# Patient Record
Sex: Female | Born: 1990 | Hispanic: No | Marital: Single
Health system: Midwestern US, Community
[De-identification: ages and names within clinical notes are randomized; demographics above are authoritative.]

## PROBLEM LIST (undated history)

## (undated) ENCOUNTER — Inpatient Hospital Stay (HOSPITAL_COMMUNITY): Payer: Self-pay

## (undated) HISTORY — PX: FRACTURE SURGERY: SHX138

## (undated) HISTORY — PX: TONSILLECTOMY: SUR1361

---

## 2013-10-28 ENCOUNTER — Encounter (HOSPITAL_COMMUNITY): Payer: Self-pay | Admitting: Emergency Medicine

## 2013-10-28 ENCOUNTER — Emergency Department (HOSPITAL_COMMUNITY)
Admission: EM | Admit: 2013-10-28 | Discharge: 2013-10-28 | Disposition: A | Discharge status: Home / Self Care | Payer: Self-pay | Attending: Emergency Medicine | Admitting: Emergency Medicine

## 2013-10-28 DIAGNOSIS — L02219 Cutaneous abscess of trunk, unspecified: Secondary | ICD-10-CM | POA: Insufficient documentation

## 2013-10-28 DIAGNOSIS — F17200 Nicotine dependence, unspecified, uncomplicated: Secondary | ICD-10-CM | POA: Insufficient documentation

## 2013-10-28 DIAGNOSIS — L0390 Cellulitis, unspecified: Secondary | ICD-10-CM

## 2013-10-28 MED ORDER — CEPHALEXIN 500 MG PO CAPS: 500.0000 mg | ORAL_CAPSULE | Freq: Four times a day (QID) | ORAL | Status: DC

## 2013-10-28 MED ORDER — SULFAMETHOXAZOLE-TRIMETHOPRIM 800-160 MG PO TABS: 1.0000 | ORAL_TABLET | Freq: Two times a day (BID) | ORAL | Status: DC

## 2013-10-28 MED ORDER — HYDROCODONE-ACETAMINOPHEN 5-325 MG PO TABS: 1.0000 | ORAL_TABLET | Freq: Four times a day (QID) | ORAL | Status: DC | PRN

## 2013-10-28 MED ORDER — ONDANSETRON 4 MG PO TBDP
8.0000 mg | ORAL_TABLET | Freq: Once | ORAL | Status: AC
  Administered 2013-10-28: 8 mg via ORAL
  Filled 2013-10-28: qty 2

## 2013-10-28 MED ORDER — PROMETHAZINE HCL 25 MG PO TABS
25.0000 mg | ORAL_TABLET | Freq: Four times a day (QID) | ORAL | Status: DC | PRN
Start: 2013-10-28 — End: 2014-03-15

## 2013-10-28 MED ORDER — HYDROCODONE-ACETAMINOPHEN 5-325 MG PO TABS
2.0000 | ORAL_TABLET | Freq: Once | ORAL | Status: AC
  Administered 2013-10-28: 2 via ORAL
  Filled 2013-10-28: qty 2

## 2013-10-28 NOTE — ED Notes (Signed)
Pt states there is a place on her lower back that she initially thought was a pimple.  Pt states she "mashed" it and then it got worse.  Wound is approximately the size of a nickel, scabbed over, redness surrounding.

## 2013-10-28 NOTE — ED Provider Notes (Signed)
Medical screening examination/treatment/procedure(s) were performed by non-physician practitioner and as supervising physician I was immediately available for consultation/collaboration.  Sherryl Valido T Zanita Millman, MD 10/28/13 1603 

## 2013-10-28 NOTE — ED Provider Notes (Signed)
CSN: [id number]     Arrival date & time 10/28/13  1335 History   First MD Initiated Contact with Patient 10/28/13 1443    This chart was scribed for Claudette Head PA-C, a non-physician practitioner working with Toy Baker, MD by Lewanda Rife, ED Scribe. This patient was seen in room TR10C/TR10C and the patient's care was started at 2:46 PM     Chief Complaint  Patient presents with  . Abscess   (Consider location/radiation/quality/duration/timing/severity/associated sxs/prior Treatment) The history is provided by the patient. No language interpreter was used.    HPI Comments: Sophia Warner is a 22 y.o. female who presents to the Emergency Department complaining of worsening abscess on low back onset 3 days. Describes pain as severe and throbbing. Reports friends have tried to "pop" it and expelled moderate drainage yesterday. Denies associated fever, and other lesions. Denies PMHx of abscesses. Denies other significant PMHx   History reviewed. No pertinent past medical history. Past Surgical History  Procedure Laterality Date  . Tonsillectomy    . Fracture surgery     No family history on file. History  Substance Use Topics  . Smoking status: Current Every Day Smoker -- 1.00 packs/day    Types: Cigarettes  . Smokeless tobacco: Not on file  . Alcohol Use: Yes   OB History   Grav Para Term Preterm Abortions TAB SAB Ect Mult Living                 Review of Systems  Constitutional: Negative for fever.  Skin:       Abscess   A complete 10 system review of systems was obtained and all systems are negative except as noted in the HPI and PMHx.     Allergies  Review of patient's allergies indicates no known allergies.  Home Medications  No current outpatient prescriptions on file. BP 118/81  Pulse 114  Temp(Src) 98.1 F (36.7 C) (Oral)  Resp 18  Wt 119 lb 4.8 oz (54.114 kg)  SpO2 100%  LMP 10/28/2013 Physical Exam  Nursing note and vitals  reviewed. Constitutional: She is oriented to person, place, and time. She appears well-developed and well-nourished. No distress.  HENT:  Head: Normocephalic and atraumatic.  Right Ear: External ear normal.  Left Ear: External ear normal.  Nose: Nose normal.  Mouth/Throat: Oropharynx is clear and moist.  Eyes: Conjunctivae are normal.  Neck: Normal range of motion.  Cardiovascular: Normal rate, regular rhythm and normal heart sounds.   Pulmonary/Chest: Effort normal and breath sounds normal. No stridor. No respiratory distress. She has no wheezes. She has no rales.  Abdominal: Soft. She exhibits no distension.  Musculoskeletal: Normal range of motion.  Neurological: She is alert and oriented to person, place, and time. She has normal strength.  Skin: Skin is warm and dry. She is not diaphoretic. There is erythema.     Indurated abscess with mild warmth noted over low back about 4 cm in diameter with surrounding erythema. No drainage noted. Scabbing noted. No fluctuance, and no streaking.  Psychiatric: She has a normal mood and affect. Her behavior is normal.    ED Course  Procedures (including critical care time)  COORDINATION OF CARE:  Nursing notes reviewed. Vital signs reviewed. Initial pt interview and examination performed.   2:54 PM-Discussed work up plan with pt at bedside, which includes bedside US. Pt agrees with plan.  2:58 PM No drainable abscess visualized with bedside US  Treatment plan initiated:Medications - No data to display  Initial diagnostic testing ordered.    Labs Review Labs Reviewed - No data to display Imaging Review No results found.  EKG Interpretation   None       MDM   1. Cellulitis    Suspect uncomplicated cellulitis based on limited area of involvement, no systemic signs of illness (eg, fever, chills, dehydration, altered mental status, tachypnea, hypotension), no risk factors for serious illness (eg, extremes of age, general  debility, immunocompromised status).   PE reveals redness, swelling, mildly tender, warm to touch. No bleeding. No bullae. Non purulent. Non circumferential.  Borders are not elevated or sharply demarcated.  Preformed ultrasound and did not detect any occult abscess. Discussed that she needs follow up in 2 days either here for urgent care for possible abscess development.  Will prescribed Bactrim to cover for MRSA, direct pt to apply warm compresses and to return to ED for I&D if pain should increase or abscess should develop. Note for work given.       I personally performed the services described in this documentation, which was scribed in my presence. The recorded information has been reviewed and is accurate.    Mora Bellman, PA-C 10/28/13 [phone]

## 2013-10-28 NOTE — ED Notes (Signed)
Pt had "sore" on mid-back x 2 days. Has had friends "mash it". Now has scabbed area with 8 cm of redness surrounding it. States is very painful.

## 2014-02-26 ENCOUNTER — Inpatient Hospital Stay (HOSPITAL_COMMUNITY)
Admission: AD | Admit: 2014-02-26 | Discharge: 2014-02-26 | Disposition: A | Discharge status: Home / Self Care | Payer: Self-pay | Source: Ambulatory Visit | Attending: Family Medicine | Admitting: Family Medicine

## 2014-02-26 NOTE — MAU Note (Signed)
Patient states she has had a positive pregnancy test at [facility]. States she was told that due to her negative blood type she needed to go the the hospital immediately for a Rhophylac injection. Patient states she is not having any pain or bleeding. rhophylac information booklet and sheet given to the patient and her partner. Explained that an injection would be needed if there was bleeding or trauma in early pregnancy, would be given routienly at 28 weeks and again after delivery depending on the baby's blood type. Patient and her partner understand and are relieved that she does not need a shot at this time.

## 2014-03-15 ENCOUNTER — Inpatient Hospital Stay (HOSPITAL_COMMUNITY): Payer: Self-pay

## 2014-03-15 ENCOUNTER — Encounter (HOSPITAL_COMMUNITY): Payer: Self-pay | Admitting: *Deleted

## 2014-03-15 ENCOUNTER — Inpatient Hospital Stay (HOSPITAL_COMMUNITY)
Admission: AD | Admit: 2014-03-15 | Discharge: 2014-03-15 | Disposition: A | Discharge status: Home / Self Care | Payer: Self-pay | Source: Ambulatory Visit | Attending: Family Medicine | Admitting: Family Medicine

## 2014-03-15 DIAGNOSIS — O99891 Other specified diseases and conditions complicating pregnancy: Secondary | ICD-10-CM | POA: Insufficient documentation

## 2014-03-15 DIAGNOSIS — O26899 Other specified pregnancy related conditions, unspecified trimester: Secondary | ICD-10-CM

## 2014-03-15 DIAGNOSIS — O418X10 Other specified disorders of amniotic fluid and membranes, first trimester, not applicable or unspecified: Secondary | ICD-10-CM

## 2014-03-15 DIAGNOSIS — R109 Unspecified abdominal pain: Secondary | ICD-10-CM | POA: Insufficient documentation

## 2014-03-15 DIAGNOSIS — B9689 Other specified bacterial agents as the cause of diseases classified elsewhere: Secondary | ICD-10-CM

## 2014-03-15 DIAGNOSIS — R1084 Generalized abdominal pain: Secondary | ICD-10-CM

## 2014-03-15 DIAGNOSIS — O208 Other hemorrhage in early pregnancy: Secondary | ICD-10-CM | POA: Insufficient documentation

## 2014-03-15 DIAGNOSIS — N949 Unspecified condition associated with female genital organs and menstrual cycle: Secondary | ICD-10-CM | POA: Insufficient documentation

## 2014-03-15 DIAGNOSIS — N760 Acute vaginitis: Secondary | ICD-10-CM

## 2014-03-15 DIAGNOSIS — O468X1 Other antepartum hemorrhage, first trimester: Secondary | ICD-10-CM

## 2014-03-15 DIAGNOSIS — O9989 Other specified diseases and conditions complicating pregnancy, childbirth and the puerperium: Secondary | ICD-10-CM

## 2014-03-15 LAB — URINALYSIS, ROUTINE W REFLEX MICROSCOPIC
Bilirubin Urine: NEGATIVE
Glucose, UA: NEGATIVE mg/dL
Hgb urine dipstick: NEGATIVE
Ketones, ur: NEGATIVE mg/dL
Leukocytes, UA: NEGATIVE
NITRITE: NEGATIVE
Protein, ur: NEGATIVE mg/dL
UROBILINOGEN UA: 0.2 mg/dL (ref 0.0–1.0)
pH: 6 (ref 5.0–8.0)

## 2014-03-15 LAB — CBC WITH DIFFERENTIAL/PLATELET
BASOS ABS: 0 10*3/uL (ref 0.0–0.1)
Basophils Relative: 1 % (ref 0–1)
EOS PCT: 3 % (ref 0–5)
Eosinophils Absolute: 0.1 10*3/uL (ref 0.0–0.7)
HEMATOCRIT: 37.3 % (ref 36.0–46.0)
Hemoglobin: 13.4 g/dL (ref 12.0–15.0)
Lymphocytes Relative: 29 % (ref 12–46)
Lymphs Abs: 1.2 10*3/uL (ref 0.7–4.0)
MCH: 30.8 pg (ref 26.0–34.0)
MCHC: 35.9 g/dL (ref 30.0–36.0)
MCV: 85.7 fL (ref 78.0–100.0)
MONO ABS: 0.5 10*3/uL (ref 0.1–1.0)
Monocytes Relative: 13 % — ABNORMAL HIGH (ref 3–12)
NEUTROS ABS: 2.3 10*3/uL (ref 1.7–7.7)
Neutrophils Relative %: 55 % (ref 43–77)
Platelets: 83 10*3/uL — ABNORMAL LOW (ref 150–400)
RBC: 4.35 MIL/uL (ref 3.87–5.11)
RDW: 12.2 % (ref 11.5–15.5)
WBC: 4.2 10*3/uL (ref 4.0–10.5)

## 2014-03-15 LAB — WET PREP, GENITAL
Trich, Wet Prep: NONE SEEN
Yeast Wet Prep HPF POC: NONE SEEN

## 2014-03-15 LAB — ABO/RH: ABO/RH(D): AB NEG

## 2014-03-15 LAB — HCG, QUANTITATIVE, PREGNANCY: hCG, Beta Chain, Quant, S: 68805 m[IU]/mL — ABNORMAL HIGH (ref <5)

## 2014-03-15 MED ORDER — METRONIDAZOLE 500 MG PO TABS: 500.0000 mg | ORAL_TABLET | Freq: Two times a day (BID) | ORAL | Status: AC

## 2014-03-15 NOTE — MAU Note (Signed)
Abdominal pain for the last 8 hours-described as cramping and sharp. States the pain started after a dog jumped on her abdomen. Denies vaginal pain, abnormal vaginal discharge and urinary symptoms.

## 2014-03-15 NOTE — MAU Provider Note (Signed)
CSN: [id number]     Arrival date & time 03/15/14  1922 History   None    Chief Complaint  Patient presents with  . Abdominal Pain     (Consider location/radiation/quality/duration/timing/severity/associated sxs/prior Treatment) Patient is a 23 y.o. female presenting with abdominal pain. The history is provided by the patient.  Abdominal Pain The primary symptoms of the illness include abdominal pain, nausea and vomiting. The primary symptoms of the illness do not include fever, shortness of breath, vaginal discharge or vaginal bleeding. The current episode started 6 to 12 hours ago. The onset of the illness was sudden.  The patient states that she believes she is currently pregnant. Symptoms associated with the illness do not include anorexia, urgency, hematuria, frequency or back pain.   Marcine MatarKyra Galster is a 23 y.o. G2P0020 @ approximately [redacted] weeks gestation who presents to the MAU with abdominal pain that started earlier today. She describes the pain as cramping and sharp. She was going to adopt a dog and the dog jumped on her abdomen. She started cramping after that. She denies vaginal bleeding.   History reviewed. No pertinent past medical history. Past Surgical History  Procedure Laterality Date  . Tonsillectomy    . Fracture surgery     History reviewed. No pertinent family history. History  Substance Use Topics  . Smoking status: Current Every Day Smoker -- 0.25 packs/day    Types: Cigarettes  . Smokeless tobacco: Not on file  . Alcohol Use: Yes   OB History   Grav Para Term Preterm Abortions TAB SAB Ect Mult Living   3 0   2  2   0     Review of Systems  Constitutional: Negative for fever.  HENT: Negative.   Eyes: Negative for visual disturbance.  Respiratory: Negative for cough and shortness of breath.   Cardiovascular: Negative for chest pain.  Gastrointestinal: Positive for nausea, vomiting and abdominal pain. Negative for anorexia.  Genitourinary: Negative for  urgency, frequency, hematuria, vaginal bleeding and vaginal discharge.  Musculoskeletal: Negative for back pain.  Skin: Negative for rash.  Psychiatric/Behavioral: Negative for confusion. The patient is not nervous/anxious.       Allergies  Review of patient's allergies indicates no known allergies.  Home Medications   Prior to Admission medications   Medication Sig Start Date End Date Taking? Authorizing Provider  cephALEXin (KEFLEX) 500 MG capsule Take 1 capsule (500 mg total) by mouth 4 (four) times daily. 10/28/13   Mora BellmanHannah S Merrell, PA-C  HYDROcodone-acetaminophen (NORCO/VICODIN) 5-325 MG per tablet Take 1-2 tablets by mouth every 6 (six) hours as needed for severe pain. 10/28/13   Mora BellmanHannah S Merrell, PA-C  promethazine (PHENERGAN) 25 MG tablet Take 1 tablet (25 mg total) by mouth every 6 (six) hours as needed for nausea or vomiting. 10/28/13   Mora BellmanHannah S Merrell, PA-C  sulfamethoxazole-trimethoprim (SEPTRA DS) 800-160 MG per tablet Take 1 tablet by mouth every 12 (twelve) hours. 10/28/13   Mora BellmanHannah S Merrell, PA-C   BP 104/71  Pulse 86  Temp(Src) 98.1 F (36.7 C)  Resp 16  Ht 5\' 3"  (1.6 m)  Wt 111 lb (50.349 kg)  BMI 19.67 kg/m2  SpO2 100%  LMP 01/19/2014 Physical Exam  Nursing note and vitals reviewed. Constitutional: She is oriented to person, place, and time. She appears well-developed and well-nourished.  HENT:  Head: Normocephalic.  Eyes: EOM are normal.  Neck: Neck supple.  Cardiovascular: Normal rate.   Pulmonary/Chest: Effort normal.  Abdominal: Soft. There is no  tenderness.  Genitourinary:  External genitalia without lesions. Frothy malodorous discharge vaginal vault. Cervix long, closed, no CMT, no adnexal tenderness. Uterus approximately 8 week size.   Musculoskeletal: Normal range of motion.  Neurological: She is alert and oriented to person, place, and time. No cranial nerve deficit.  Skin: Skin is warm and dry.  Psychiatric: She has a normal mood and affect.  Her behavior is normal.    ED Course  Procedures I Results for orders placed during the hospital encounter of 03/15/14 (from the past 24 hour(s))  CBC WITH DIFFERENTIAL     Status: Abnormal   Collection Time    03/15/14  7:29 PM      Result Value Ref Range   WBC 4.2  4.0 - 10.5 K/uL   RBC 4.35  3.87 - 5.11 MIL/uL   Hemoglobin 13.4  12.0 - 15.0 g/dL   HCT 16.1  09.6 - 04.5 %   MCV 85.7  78.0 - 100.0 fL   MCH 30.8  26.0 - 34.0 pg   MCHC 35.9  30.0 - 36.0 g/dL   RDW 40.9  81.1 - 91.4 %   Platelets 83 (*) 150 - 400 K/uL   Neutrophils Relative % 55  43 - 77 %   Neutro Abs 2.3  1.7 - 7.7 K/uL   Lymphocytes Relative 29  12 - 46 %   Lymphs Abs 1.2  0.7 - 4.0 K/uL   Monocytes Relative 13 (*) 3 - 12 %   Monocytes Absolute 0.5  0.1 - 1.0 K/uL   Eosinophils Relative 3  0 - 5 %   Eosinophils Absolute 0.1  0.0 - 0.7 K/uL   Basophils Relative 1  0 - 1 %   Basophils Absolute 0.0  0.0 - 0.1 K/uL  HCG, QUANTITATIVE, PREGNANCY     Status: Abnormal   Collection Time    03/15/14  7:29 PM      Result Value Ref Range   hCG, Beta Chain, [place]est [address] (*) <5 mIU/mL  ABO/RH     Status: None   Collection Time    03/15/14  7:29 PM      Result Value Ref Range   ABO/RH(D) AB NEG    URINALYSIS, ROUTINE W REFLEX MICROSCOPIC     Status: Abnormal   Collection Time    03/15/14  7:31 PM      Result Value Ref Range   Color, Urine YELLOW  YELLOW   APPearance CLEAR  CLEAR   Specific Gravity, Urine >1.030 (*) 1.005 - 1.030   pH 6.0  5.0 - 8.0   Glucose, UA NEGATIVE  NEGATIVE mg/dL   Hgb urine dipstick NEGATIVE  NEGATIVE   Bilirubin Urine NEGATIVE  NEGATIVE   Ketones, ur NEGATIVE  NEGATIVE mg/dL   Protein, ur NEGATIVE  NEGATIVE mg/dL   Urobilinogen, UA 0.2  0.0 - 1.0 mg/dL   Nitrite NEGATIVE  NEGATIVE   Leukocytes, UA NEGATIVE  NEGATIVE  WET PREP, GENITAL     Status: Abnormal   Collection Time    03/15/14  8:10 PM      Result Value Ref Range   Yeast Wet Prep HPF POC NONE SEEN  NONE SEEN   Trich,  Wet Prep NONE SEEN  NONE SEEN   Clue Cells Wet Prep HPF POC MODERATE (*) NONE SEEN   WBC, Wet Prep HPF POC FEW (*) NONE SEEN   US Ob Comp Less 14 Wks  03/15/2014   CLINICAL DATA:  Pelvic pain.  EXAM: OBSTETRIC <14 WK [place] AND TRANSVAGINAL OB US  TECHNIQUE: Both transabdominal and transvaginal ultrasound examinations were performed for complete evaluation of the gestation as well as the maternal uterus, adnexal regions, and pelvic cul-de-sac. Transvaginal technique was performed to assess early pregnancy.  COMPARISON:  None.  FINDINGS: Intrauterine gestational sac: Visualized/normal in shape.  Yolk sac:  Yes  Embryo:  Yes  Cardiac Activity: Yes  Heart Rate: 150 bpm  CRL:   1.5 cm   7 w 6 d                  [place] EDC: 10/26/2014  Maternal uterus/adnexae: A small amount of subchorionic hemorrhage is noted. The uterus is otherwise unremarkable in appearance.  The ovaries are within normal limits. The right ovary measures 2.9 x 1.6 x 1.1 cm, while the left ovary measures 3.1 x 2.2 x 2.7 cm. No suspicious adnexal masses are seen; there is no evidence for ovarian torsion.  No free fluid is seen within the pelvic cul-de-sac.  IMPRESSION: 1. Single live intrauterine pregnancy noted, with a crown-rump length of 1.5 cm, corresponding to a gestational age of [redacted] weeks 6 days. This matches the gestational age by LMP, reflecting an estimated date of delivery of December 8th, 2015. 2. Small amount of subchorionic hemorrhage noted.   Electronically Signed   By: Roanna Raider M.D.   On: 03/15/2014 21:53   US Ob Transvaginal  03/15/2014   CLINICAL DATA:  Pelvic pain.  EXAM: OBSTETRIC <14 WK [place] AND TRANSVAGINAL OB US  TECHNIQUE: Both transabdominal and transvaginal ultrasound examinations were performed for complete evaluation of the gestation as well as the maternal uterus, adnexal regions, and pelvic cul-de-sac. Transvaginal technique was performed to assess early pregnancy.  COMPARISON:  None.  FINDINGS: Intrauterine gestational  sac: Visualized/normal in shape.  Yolk sac:  Yes  Embryo:  Yes  Cardiac Activity: Yes  Heart Rate: 150 bpm  CRL:   1.5 cm   7 w 6 d                  [place] EDC: 10/26/2014  Maternal uterus/adnexae: A small amount of subchorionic hemorrhage is noted. The uterus is otherwise unremarkable in appearance.  The ovaries are within normal limits. The right ovary measures 2.9 x 1.6 x 1.1 cm, while the left ovary measures 3.1 x 2.2 x 2.7 cm. No suspicious adnexal masses are seen; there is no evidence for ovarian torsion.  No free fluid is seen within the pelvic cul-de-sac.  IMPRESSION: 1. Single live intrauterine pregnancy noted, with a crown-rump length of 1.5 cm, corresponding to a gestational age of [redacted] weeks 6 days. This matches the gestational age by LMP, reflecting an estimated date of delivery of December 8th, 2015. 2. Small amount of subchorionic hemorrhage noted.   Electronically Signed   By: Roanna Raider M.D.   On: 03/15/2014 21:53    MDM  22 y.o. [id number] @ [redacted]w[redacted]d weeks gestation by ultrasound today with abdominal pain after a dog jumped on her. She has not started prenatal care and has not had an ultrasound until today. Stable for discharge with viable IUP. Normal CBC, will treat BV and she will schedule an appointment to start prenatal care. She will return here as needed. I have reviewed this patient's vital signs, nurses notes, appropriate labs and imaging.     Medication List    TAKE these medications       metroNIDAZOLE 500 MG tablet  Commonly known as:  FLAGYL  Take 1 tablet (500 mg total) by mouth 2 (two) times daily.      ASK your doctor about these medications       prenatal multivitamin Tabs tablet  Take 1 tablet by mouth daily at 12 noon.

## 2014-03-16 LAB — GC/CHLAMYDIA PROBE AMP
CT Probe RNA: NEGATIVE
GC Probe RNA: NEGATIVE

## 2014-03-18 NOTE — MAU Provider Note (Signed)
Attestation of Attending Supervision of Advanced Practitioner (PA/CNM/NP): Evaluation and management procedures were performed by the Advanced Practitioner under my supervision and collaboration.  I have reviewed the Advanced Practitioner's note and chart, and I agree with the management and plan.  Reva Boresanya S Rheya Minogue, MD [facility] Attending 03/18/2014 3:54 PM

## 2014-09-20 ENCOUNTER — Encounter (HOSPITAL_COMMUNITY): Payer: Self-pay | Admitting: *Deleted

## 2015-01-18 ENCOUNTER — Encounter (HOSPITAL_COMMUNITY): Payer: Self-pay | Admitting: *Deleted

## 2015-09-18 IMAGING — US US OB TRANSVAGINAL
1 series · 13 of 28 positions shown · non-contrast
Comparison: None.

CLINICAL DATA: Pelvic pain.

EXAM:
OBSTETRIC <14 WK US AND TRANSVAGINAL OB US
TECHNIQUE: Both transabdominal and transvaginal ultrasound examinations were
performed for complete evaluation of the gestation as well as the
maternal uterus, adnexal regions, and pelvic cul-de-sac.
Transvaginal technique was performed to assess early pregnancy.

[Series 1: us ob comp less 14 wks · 13 of 43 slices shown]
[im 2/43]
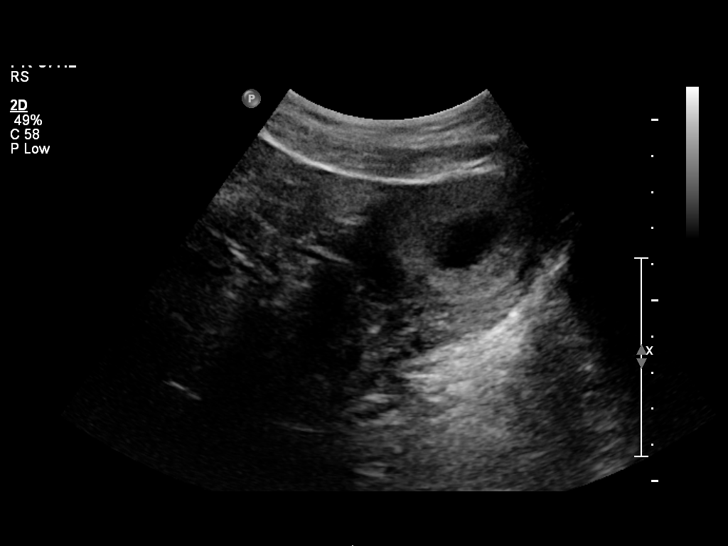
[im 5/43]
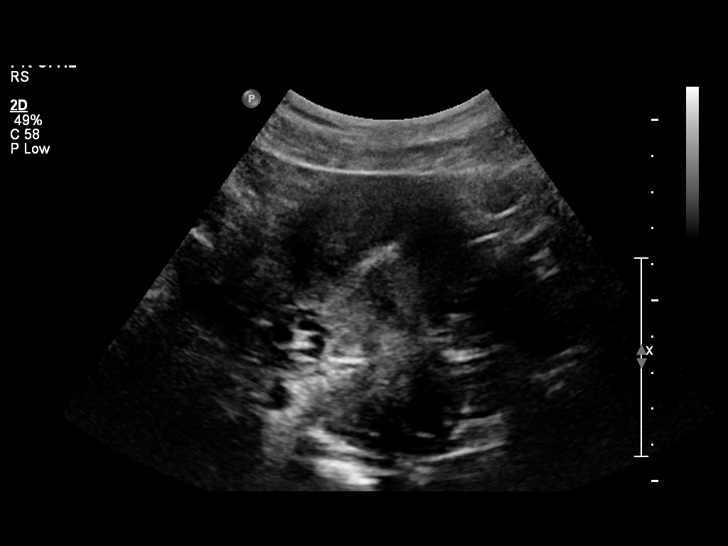
[im 8/43]
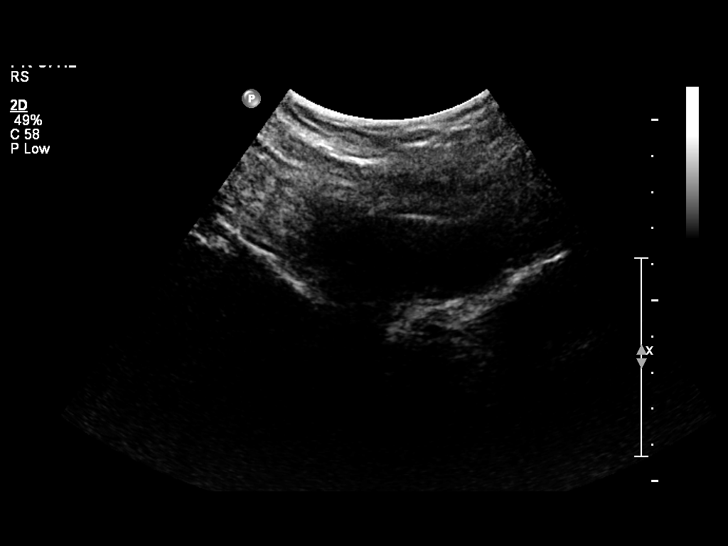
[im 11/43]
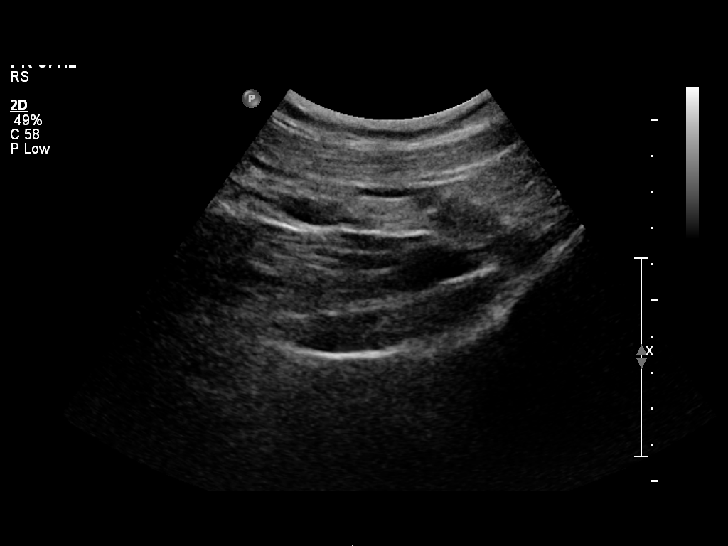
[im 15/43]
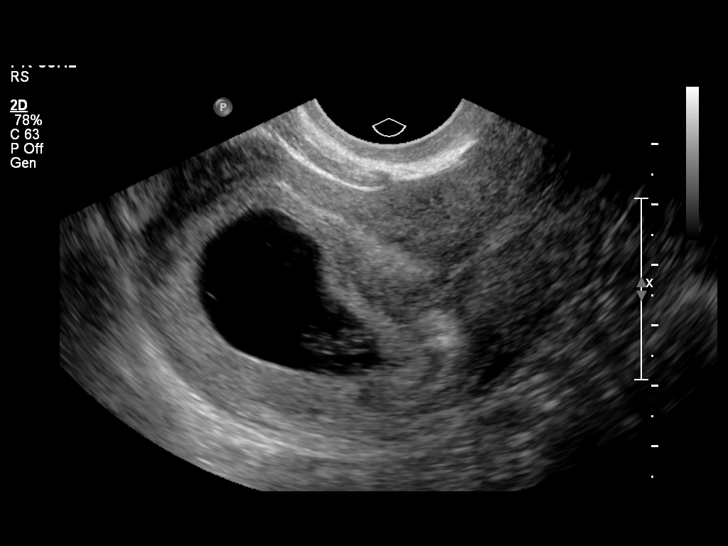
[im 18/43]
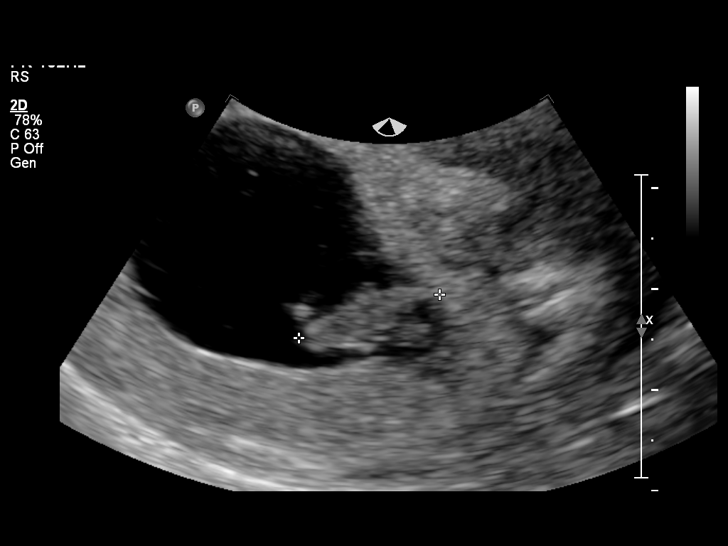
[im 22/43]
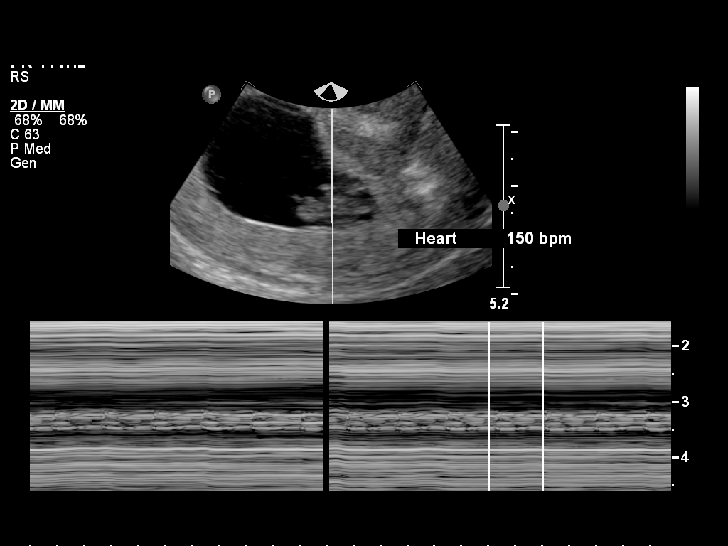
[im 25/43]
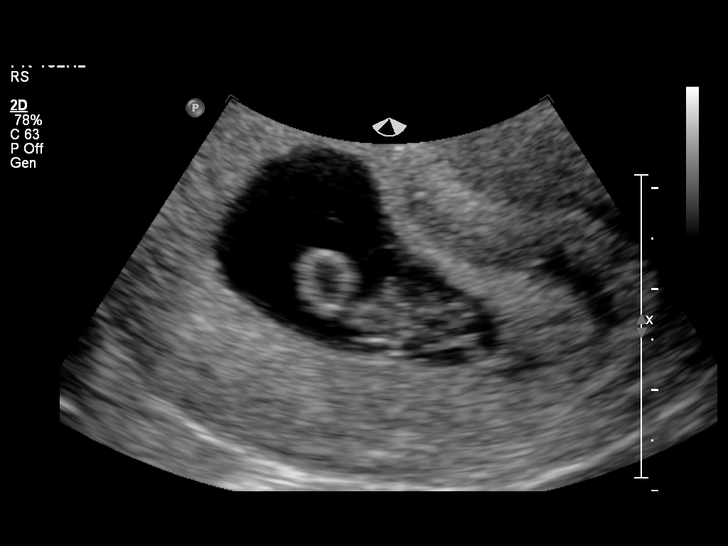
[im 29/43]
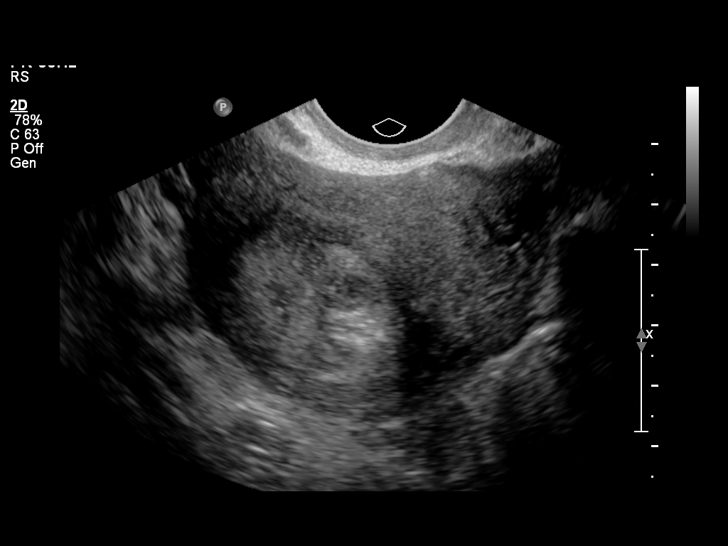
[im 32/43]
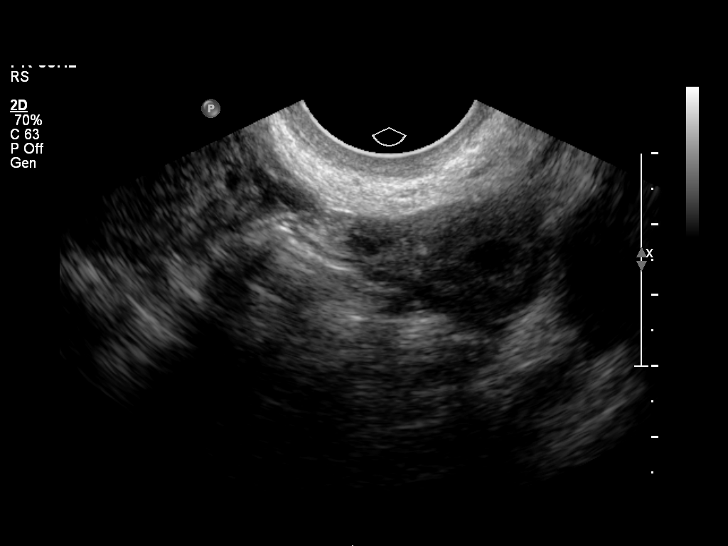
[im 35/43]
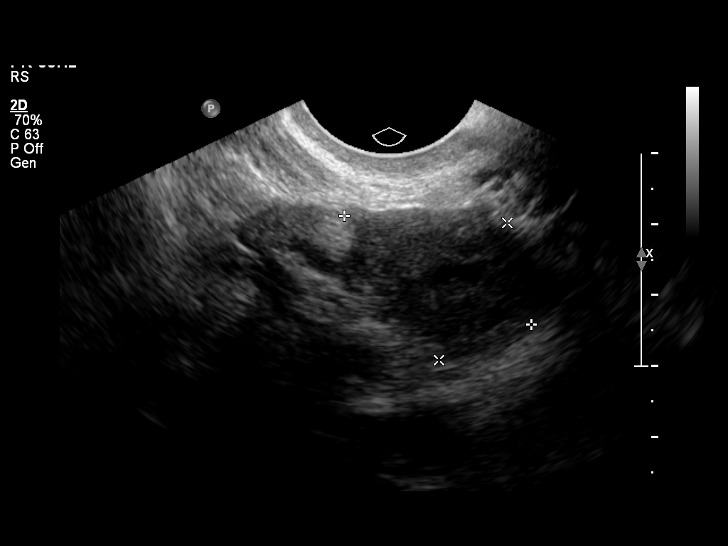
[im 38/43]
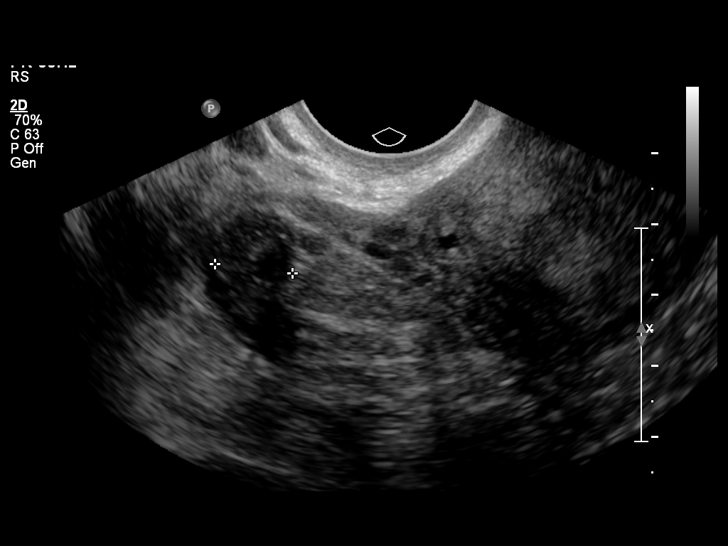
[im 41/43]
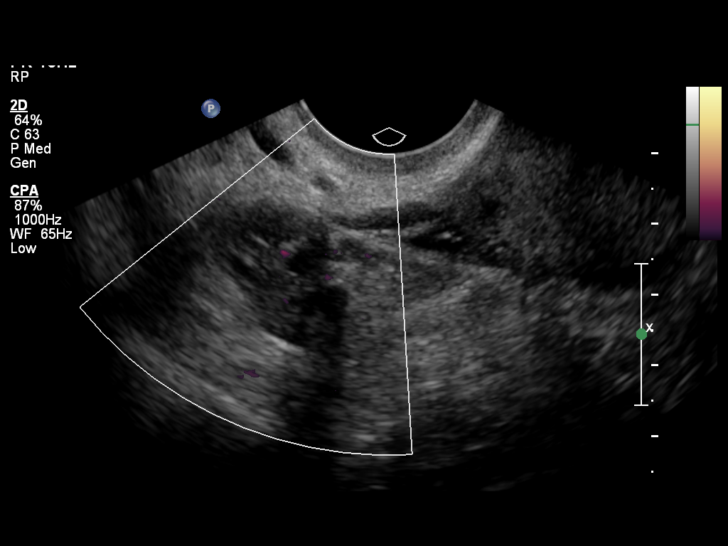

[13 of 28 positions shown; findings below may reference images not displayed]

FINDINGS: Intrauterine gestational sac: Visualized/normal in shape.

Yolk sac:  Yes

Embryo:  Yes

Cardiac Activity: Yes

Heart Rate: 150 bpm

CRL:   1.5 cm   7 w 6 d                  US EDC: 10/26/2014

Maternal uterus/adnexae: A small amount of subchorionic hemorrhage
is noted. The uterus is otherwise unremarkable in appearance.

The ovaries are within normal limits. The right ovary measures 2.9 x
1.6 x 1.1 cm, while the left ovary measures 3.1 x 2.2 x 2.7 cm. No
suspicious adnexal masses are seen; there is no evidence for ovarian
torsion.

No free fluid is seen within the pelvic cul-de-sac.
IMPRESSION: 1. Single live intrauterine pregnancy noted, with a crown-rump
length of 1.5 cm, corresponding to a gestational age of 7 weeks 6
days. This matches the gestational age by LMP, reflecting an
estimated date of delivery [REDACTED], 5276.
2. Small amount of subchorionic hemorrhage noted.

## 2018-12-16 ENCOUNTER — Inpatient Hospital Stay
Admit: 2018-12-16 | Discharge: 2018-12-16 | Disposition: A | Discharge status: Home / Self Care | Payer: Self-pay | Attending: Emergency Medicine

## 2018-12-16 ENCOUNTER — Emergency Department: Admit: 2018-12-16 | Payer: Self-pay | Primary: Student in an Organized Health Care Education/Training Program

## 2018-12-16 DIAGNOSIS — J069 Acute upper respiratory infection, unspecified: Secondary | ICD-10-CM

## 2018-12-16 LAB — CBC WITH AUTO DIFFERENTIAL
Atypical Lymphocytes: 9 %
Band Neutrophils: 9 % — ABNORMAL HIGH
Basophils %: 0 % (ref 0.0–3.0)
Basophils Absolute: 0 10*3/uL (ref 0.0–0.4)
Eosinophils %: 1 % (ref 0.0–7.0)
Eosinophils Absolute: 0 10*3/uL (ref 0.0–1.0)
Granulocyte Absolute Count: 0 10*3/uL
Hematocrit: 44.6 % (ref 36.0–47.0)
Hemoglobin: 15.5 g/dL (ref 12.0–16.0)
Immature Granulocytes: 0 %
Lymphocytes %: 29 % (ref 18.0–40.0)
Lymphocytes Absolute: 1.4 10*3/uL (ref 0.9–4.2)
MCH: 31.1 PG (ref 27.0–35.0)
MCHC: 34.8 g/dL (ref 30.7–37.3)
MCV: 89.4 FL (ref 81.0–94.0)
MPV: 10.6 FL (ref 9.2–11.8)
Monocytes %: 9 % (ref 2.0–12.0)
Monocytes Absolute: 0.3 10*3/uL (ref 0.1–1.7)
NRBC Absolute: 0 10*3/uL (ref 0.0–0.01)
Neutrophils %: 43 % — ABNORMAL LOW (ref 48.0–72.0)
Neutrophils Absolute: 1.9 10*3/uL — ABNORMAL LOW (ref 2.3–7.6)
Nucleated RBCs: 0 PER 100 WBC
Platelet Estimate: ADEQUATE
Platelets: 152 10*3/uL (ref 130–400)
RBC: 4.99 M/uL (ref 4.20–5.40)
RDW: 11.4 % — ABNORMAL LOW (ref 11.5–14.0)
WBC: 3.6 10*3/uL — ABNORMAL LOW (ref 4.8–10.6)

## 2018-12-16 LAB — URINE MICROSCOPIC

## 2018-12-16 LAB — URINALYSIS W/ RFLX MICROSCOPIC
Bilirubin UA, confirm: POSITIVE
Bilirubin Urine: POSITIVE
Glucose, Ur: NEGATIVE mg/dL
Glucose: NEGATIVE mg/dL
Ketone: 5 mg/dL — AB
Ketones, Urine: 5 mg/dL — AB
Nitrite, Urine: NEGATIVE
Nitrites: NEGATIVE
Protein, UA: 30 mg/dL — AB
Protein: 30 mg/dL — AB
Specific Gravity, UA: >1.03 NA — ABNORMAL HIGH (ref 1.005–1.030)
Specific gravity: >1.03 — ABNORMAL HIGH (ref 1.005–1.030)
Urobilinogen, UA, POCT: 0.2 EU/dL (ref 0.1–1.0)
Urobilinogen: 0.2 EU/dL (ref 0.1–1.0)
pH (UA): 6 (ref 5.0–8.0)
pH, UA: 6 (ref 5.0–8.0)

## 2018-12-16 LAB — COMPREHENSIVE METABOLIC PANEL
ALT: 17 U/L (ref 12–78)
AST: 28 U/L (ref 15–37)
Albumin/Globulin Ratio: 0.9 — ABNORMAL LOW (ref 1.0–1.5)
Albumin: 3.9 g/dL (ref 3.4–5.0)
Alkaline Phosphatase: 67 U/L (ref 46–116)
Anion Gap: 7 mmol/L — ABNORMAL LOW (ref 8–20)
BUN: 10 mg/dL (ref 7–18)
CO2: 30 mmol/L (ref 21–32)
Calcium: 8.7 mg/dL (ref 8.5–10.1)
Chloride: 102 mmol/L (ref 98–107)
Creatinine: 0.87 mg/dL (ref 0.55–1.02)
EGFR IF NonAfrican American: >60 mL/min/{1.73_m2} (ref >60)
GFR African American: >60 mL/min/{1.73_m2} (ref >60)
Globulin: 4.3 g/dL (ref 2.5–5.0)
Glucose: 103 mg/dL (ref 74–106)
Potassium: 3.9 mmol/L (ref 3.5–5.1)
Sodium: 138 mmol/L (ref 136–145)
Total Bilirubin: 0.3 mg/dL (ref 0.2–1.0)
Total Protein: 8.2 g/dL (ref 6.4–8.2)

## 2018-12-16 LAB — RAPID INFLUENZA A/B ANTIGENS
Comment: NEGATIVE
Influenza A Ag: NEGATIVE
Influenza B Ag: NEGATIVE

## 2018-12-16 LAB — HCG URINE, QL
HCG urine, QL: NEGATIVE
Pregnancy Test(Urn): NEGATIVE

## 2018-12-16 LAB — CBC WITH AUTOMATED DIFF
ABS. BASOPHILS: 0 10*3/uL (ref 0.0–0.4)
ABS. EOSINOPHILS: 0 10*3/uL (ref 0.0–1.0)
ABS. IMM. GRANS.: 0 10*3/uL
ABS. LYMPHOCYTES: 1.4 10*3/uL (ref 0.9–4.2)
ABS. MONOCYTES: 0.3 10*3/uL (ref 0.1–1.7)
ABS. NEUTROPHILS: 1.9 10*3/uL — ABNORMAL LOW (ref 2.3–7.6)
ABSOLUTE NRBC: 0 10*3/uL (ref 0.0–0.01)
ATYPICAL LYMPHS: 9 %
BAND NEUTROPHILS: 9 % — ABNORMAL HIGH
BASOPHILS: 0 % (ref 0.0–3.0)
EOSINOPHILS: 1 % (ref 0.0–7.0)
HCT: 44.6 % (ref 36.0–47.0)
HGB: 15.5 g/dL (ref 12.0–16.0)
IMMATURE GRANULOCYTES: 0 %
LYMPHOCYTES: 29 % (ref 18.0–40.0)
MCH: 31.1 PG (ref 27.0–35.0)
MCHC: 34.8 g/dL (ref 30.7–37.3)
MCV: 89.4 FL (ref 81.0–94.0)
MONOCYTES: 9 % (ref 2.0–12.0)
MPV: 10.6 FL (ref 9.2–11.8)
NEUTROPHILS: 43 % — ABNORMAL LOW (ref 48.0–72.0)
NRBC: 0 PER 100 WBC
PLATELET ESTIMATE: ADEQUATE
PLATELET: 152 10*3/uL (ref 130–400)
RBC: 4.99 M/uL (ref 4.20–5.40)
RDW: 11.4 % — ABNORMAL LOW (ref 11.5–14.0)
WBC: 3.6 10*3/uL — ABNORMAL LOW (ref 4.8–10.6)

## 2018-12-16 LAB — METABOLIC PANEL, COMPREHENSIVE
A-G Ratio: 0.9 — ABNORMAL LOW (ref 1.0–1.5)
ALT (SGPT): 17 U/L (ref 12–78)
AST (SGOT): 28 U/L (ref 15–37)
Albumin: 3.9 g/dL (ref 3.4–5.0)
Alk. phosphatase: 67 U/L (ref 46–116)
Anion gap: 7 mmol/L — ABNORMAL LOW (ref 8–20)
BUN: 10 mg/dL (ref 7–18)
Bilirubin, total: 0.3 mg/dL (ref 0.2–1.0)
CO2: 30 mmol/L (ref 21–32)
Calcium: 8.7 mg/dL (ref 8.5–10.1)
Chloride: 102 mmol/L (ref 98–107)
Creatinine: 0.87 mg/dL (ref 0.55–1.02)
GFR est AA: >60 mL/min/{1.73_m2} (ref >60)
GFR est non-AA: >60 mL/min/{1.73_m2} (ref >60)
Globulin: 4.3 g/dL (ref 2.5–5.0)
Glucose: 103 mg/dL (ref 74–106)
Potassium: 3.9 mmol/L (ref 3.5–5.1)
Protein, total: 8.2 g/dL (ref 6.4–8.2)
Sodium: 138 mmol/L (ref 136–145)

## 2018-12-16 LAB — INFLUENZA A & B AG (RAPID TEST)
Comment: NEGATIVE
Influenza A Ag: NEGATIVE
Influenza B Ag: NEGATIVE

## 2018-12-16 MED ORDER — CIPROFLOXACIN 500 MG TAB
500 mg | ORAL | Status: AC
Start: 2018-12-16 — End: 2018-12-16
  Administered 2018-12-16: 23:00:00 via ORAL

## 2018-12-16 MED ORDER — CIPROFLOXACIN 500 MG TAB
500 mg | ORAL_TABLET | Freq: Two times a day (BID) | ORAL | 0 refills | Status: AC
Start: 2018-12-16 — End: 2018-12-23

## 2018-12-16 MED FILL — CIPROFLOXACIN 500 MG TAB: 500 mg | ORAL | Qty: 1

## 2018-12-16 NOTE — ED Triage Notes (Signed)
Patient arrives to the ED with complaint of fever and sore throat and cough since Thursday. Vomit x 2 last night. No nausea or diarrhea now.

## 2018-12-16 NOTE — ED Provider Notes (Signed)
28 yo f denies sig pmh presents c/o cough, sore throat, myalgias, fever and malaise since 12/11/18.  No flu shot received.    The history is provided by the patient.   Fever    This is a new problem. The current episode started more than 2 days ago. The problem occurs every several days. The problem has not changed since onset.Her temperature was unmeasured prior to arrival. Associated symptoms include sore throat, muscle aches and cough. Pertinent negatives include no chest pain, no sleepiness, no diarrhea, no vomiting, no headaches, no shortness of breath, no mental status change, no neck pain, no rash and no urinary symptoms. She has tried nothing for the symptoms.   Cough   Associated symptoms include sore throat and myalgias. Pertinent negatives include no chest pain, no chills, no eye redness, no headaches, no rhinorrhea, no shortness of breath, no nausea and no vomiting.   Sore Throat    Associated symptoms include cough. Pertinent negatives include no diarrhea, no vomiting, no headaches and no shortness of breath.        History reviewed. No pertinent past medical history.    Past Surgical History:   Procedure Laterality Date   ??? HX ORTHOPAEDIC           History reviewed. No pertinent family history.    Social History     Socioeconomic History   ??? Marital status: Not on file     Spouse name: Not on file   ??? Number of children: Not on file   ??? Years of education: Not on file   ??? Highest education level: Not on file   Occupational History   ??? Not on file   Social Needs   ??? Financial resource strain: Not on file   ??? Food insecurity:     Worry: Not on file     Inability: Not on file   ??? Transportation needs:     Medical: Not on file     Non-medical: Not on file   Tobacco Use   ??? Smoking status: Current Every Day Smoker     Packs/day: 0.25   ??? Smokeless tobacco: Never Used   Substance and Sexual Activity   ??? Alcohol use: Yes     Comment: soc   ??? Drug use: Never   ??? Sexual activity: Not on file   Lifestyle    ??? Physical activity:     Days per week: Not on file     Minutes per session: Not on file   ??? Stress: Not on file   Relationships   ??? Social connections:     Talks on phone: Not on file     Gets together: Not on file     Attends religious service: Not on file     Active member of club or organization: Not on file     Attends meetings of clubs or organizations: Not on file     Relationship status: Not on file   ??? Intimate partner violence:     Fear of current or ex partner: Not on file     Emotionally abused: Not on file     Physically abused: Not on file     Forced sexual activity: Not on file   Other Topics Concern   ??? Not on file   Social History Narrative   ??? Not on file         ALLERGIES: Patient has no known allergies.    Review of Systems  Constitutional: Positive for fever. Negative for chills.   HENT: Positive for sore throat. Negative for rhinorrhea.    Eyes: Negative for redness and visual disturbance.   Respiratory: Positive for cough. Negative for shortness of breath.    Cardiovascular: Negative for chest pain and palpitations.   Gastrointestinal: Negative for abdominal pain, diarrhea, nausea and vomiting.   Genitourinary: Negative for difficulty urinating, dysuria and hematuria.   Musculoskeletal: Positive for myalgias. Negative for back pain, neck pain and neck stiffness.   Skin: Negative for pallor and rash.   Neurological: Negative for syncope, light-headedness and headaches.       Vitals:    12/16/18 1556 12/16/18 1604   BP:  127/82   Pulse:  100   Resp:  16   Temp:  98.9 ??F (37.2 ??C)   SpO2:  98%   Weight: 45.4 kg (100 lb)    Height: 5\' 3"  (1.6 m)             Physical Exam  Vitals signs and nursing note reviewed.   Constitutional:       General: She is not in acute distress.     Appearance: She is well-developed. She is not diaphoretic.   HENT:      Head: Normocephalic and atraumatic.   Eyes:      Pupils: Pupils are equal, round, and reactive to light.   Neck:      Musculoskeletal: Neck supple.       Vascular: No JVD.   Cardiovascular:      Rate and Rhythm: Normal rate and regular rhythm.      Heart sounds: Normal heart sounds.   Pulmonary:      Effort: No respiratory distress.      Breath sounds: Normal breath sounds.   Abdominal:      General: Bowel sounds are normal. There is no distension.      Palpations: Abdomen is soft.      Tenderness: There is no abdominal tenderness.   Musculoskeletal: Normal range of motion.   Skin:     General: Skin is warm and dry.      Coloration: Skin is not pale.   Neurological:      Mental Status: She is alert and oriented to person, place, and time.      Cranial Nerves: No cranial nerve deficit.   Psychiatric:         Mood and Affect: Mood normal.         Behavior: Behavior normal.          MDM       Procedures    <EMERGENCY DEPARTMENT CASE SUMMARY>    Impression/Differential Diagnosis: Cough, sore throat, malaise, R/O Viral illness    Plan: Exam, labs, urine, CXR, flu swab    ED Course: Labs reviewed:depressed WBC, +Bands, unremarkable chem, urine:leu trace, nit neg, mod blood, Bact 2+, WBC 10-30    Final Impression/Diagnosis: URI/UTI    Patient condition at time of disposition: Stable      I have reviewed the following home medications:    Prior to Admission medications    Not on File         Mikle Bosworth, MD

## 2018-12-16 NOTE — ED Notes (Addendum)
Patient is awake, alert, and oriented, speech is clear and patient is able to ambulate (if applicable) and ready for discharge. Verbal and written discharge instructions provided and has the cognitive understanding of discharge instructions. Discharged home with friend. All questions answered.

## 2018-12-16 NOTE — ED Notes (Signed)
Patient arrives to the ED with complaint of fever and sore throat and cough since Thursday. Vomit x 2 last night. No nausea or diarrhea now.

## 2018-12-16 NOTE — ED Notes (Signed)
Patient is awake, alert, and oriented, speech is clear and patient is able to ambulate (if applicable) and ready for discharge. Verbal and written discharge instructions provided and has the cognitive understanding of discharge instructions. Discharged home with friend. All questions answered.

## 2018-12-16 NOTE — ED Provider Notes (Signed)
28 yo f denies sig pmh presents c/o cough, sore throat, myalgias, fever and malaise since 12/11/18.  No flu shot received.    The history is provided by the patient.   Fever    This is a new problem. The current episode started more than 2 days ago. The problem occurs every several days. The problem has not changed since onset.Her temperature was unmeasured prior to arrival. Associated symptoms include sore throat, muscle aches and cough. Pertinent negatives include no chest pain, no sleepiness, no diarrhea, no vomiting, no headaches, no shortness of breath, no mental status change, no neck pain, no rash and no urinary symptoms. She has tried nothing for the symptoms.   Cough   Associated symptoms include sore throat and myalgias. Pertinent negatives include no chest pain, no chills, no eye redness, no headaches, no rhinorrhea, no shortness of breath, no nausea and no vomiting.   Sore Throat    Associated symptoms include cough. Pertinent negatives include no diarrhea, no vomiting, no headaches and no shortness of breath.        History reviewed. No pertinent past medical history.    Past Surgical History:   Procedure Laterality Date   ??? HX ORTHOPAEDIC           History reviewed. No pertinent family history.    Social History     Socioeconomic History   ??? Marital status: Not on file     Spouse name: Not on file   ??? Number of children: Not on file   ??? Years of education: Not on file   ??? Highest education level: Not on file   Occupational History   ??? Not on file   Social Needs   ??? Financial resource strain: Not on file   ??? Food insecurity:     Worry: Not on file     Inability: Not on file   ??? Transportation needs:     Medical: Not on file     Non-medical: Not on file   Tobacco Use   ??? Smoking status: Current Every Day Smoker     Packs/day: 0.25   ??? Smokeless tobacco: Never Used   Substance and Sexual Activity   ??? Alcohol use: Yes     Comment: soc   ??? Drug use: Never   ??? Sexual activity: Not on file   Lifestyle   ???  Physical activity:     Days per week: Not on file     Minutes per session: Not on file   ??? Stress: Not on file   Relationships   ??? Social connections:     Talks on phone: Not on file     Gets together: Not on file     Attends religious service: Not on file     Active member of club or organization: Not on file     Attends meetings of clubs or organizations: Not on file     Relationship status: Not on file   ??? Intimate partner violence:     Fear of current or ex partner: Not on file     Emotionally abused: Not on file     Physically abused: Not on file     Forced sexual activity: Not on file   Other Topics Concern   ??? Not on file   Social History Narrative   ??? Not on file         ALLERGIES: Patient has no known allergies.    Review of Systems  Constitutional: Positive for fever. Negative for chills.   HENT: Positive for sore throat. Negative for rhinorrhea.    Eyes: Negative for redness and visual disturbance.   Respiratory: Positive for cough. Negative for shortness of breath.    Cardiovascular: Negative for chest pain and palpitations.   Gastrointestinal: Negative for abdominal pain, diarrhea, nausea and vomiting.   Genitourinary: Negative for difficulty urinating, dysuria and hematuria.   Musculoskeletal: Positive for myalgias. Negative for back pain, neck pain and neck stiffness.   Skin: Negative for pallor and rash.   Neurological: Negative for syncope, light-headedness and headaches.       Vitals:    12/16/18 1556 12/16/18 1604   BP:  127/82   Pulse:  100   Resp:  16   Temp:  98.9 ??F (37.2 ??C)   SpO2:  98%   Weight: 45.4 kg (100 lb)    Height: 5\' 3"  (1.6 m)             Physical Exam  Vitals signs and nursing note reviewed.   Constitutional:       General: She is not in acute distress.     Appearance: She is well-developed. She is not diaphoretic.   HENT:      Head: Normocephalic and atraumatic.   Eyes:      Pupils: Pupils are equal, round, and reactive to light.   Neck:      Musculoskeletal: Neck supple.       Vascular: No JVD.   Cardiovascular:      Rate and Rhythm: Normal rate and regular rhythm.      Heart sounds: Normal heart sounds.   Pulmonary:      Effort: No respiratory distress.      Breath sounds: Normal breath sounds.   Abdominal:      General: Bowel sounds are normal. There is no distension.      Palpations: Abdomen is soft.      Tenderness: There is no abdominal tenderness.   Musculoskeletal: Normal range of motion.   Skin:     General: Skin is warm and dry.      Coloration: Skin is not pale.   Neurological:      Mental Status: She is alert and oriented to person, place, and time.      Cranial Nerves: No cranial nerve deficit.   Psychiatric:         Mood and Affect: Mood normal.         Behavior: Behavior normal.          MDM       Procedures    <EMERGENCY DEPARTMENT CASE SUMMARY>    Impression/Differential Diagnosis: Cough, sore throat, malaise, R/O Viral illness    Plan: Exam, labs, urine, CXR, flu swab    ED Course: Labs reviewed:depressed WBC, +Bands, unremarkable chem, urine:leu trace, nit neg, mod blood, Bact 2+, WBC 10-30    Final Impression/Diagnosis: URI/UTI    Patient condition at time of disposition: Stable      I have reviewed the following home medications:    Prior to Admission medications    Not on File         Mikle Bosworth, MD

## 2018-12-17 LAB — CULTURE, URINE

## 2021-01-16 ENCOUNTER — Encounter: Payer: Self-pay | Admitting: *Deleted

## 2022-03-05 ENCOUNTER — Inpatient Hospital Stay
Admit: 2022-03-05 | Discharge: 2022-03-05 | Disposition: A | Discharge status: Home / Self Care | Payer: Self-pay | Attending: Emergency Medicine

## 2022-03-05 ENCOUNTER — Emergency Department: Admit: 2022-03-05 | Payer: Self-pay | Primary: Student in an Organized Health Care Education/Training Program

## 2022-03-05 DIAGNOSIS — S29011A Strain of muscle and tendon of front wall of thorax, initial encounter: Secondary | ICD-10-CM

## 2022-03-05 LAB — URINALYSIS W/ RFLX MICROSCOPIC
Glucose, Ur: NEGATIVE mg/dL
Glucose: NEGATIVE mg/dL
Nitrite, Urine: NEGATIVE
Nitrites: NEGATIVE
Protein, UA: 30 mg/dL — AB
Protein: 30 mg/dL — AB
Specific Gravity, UA: >1.03 NA — ABNORMAL HIGH (ref 1.005–1.030)
Specific gravity: >1.03 — ABNORMAL HIGH (ref 1.005–1.030)
Urobilinogen, UA, POCT: 1 EU/dL (ref 0.2–1.0)
Urobilinogen: 1 EU/dL (ref 0.2–1.0)
pH (UA): 5.5 (ref 5.0–8.0)
pH, UA: 5.5 (ref 5.0–8.0)

## 2022-03-05 LAB — URINE MICROSCOPIC

## 2022-03-05 LAB — HCG URINE, QL
HCG urine, QL: NEGATIVE
Pregnancy Test(Urn): NEGATIVE

## 2022-03-05 LAB — BILIRUBIN, CONFIRMATORY: Bilirubin Urine: NEGATIVE

## 2022-03-05 LAB — BILIRUBIN, CONFIRM: Bilirubin UA, confirm: NEGATIVE

## 2022-03-05 MED ORDER — IBUPROFEN 600 MG TAB
600 mg | ORAL_TABLET | Freq: Four times a day (QID) | ORAL | 0 refills | Status: AC | PRN
Start: 2022-03-05 — End: ?

## 2022-03-05 MED ORDER — NITROFURANTOIN (25% MACROCRYSTAL FORM) 100 MG CAP
100 mg | ORAL_CAPSULE | Freq: Two times a day (BID) | ORAL | 0 refills | Status: AC
Start: 2022-03-05 — End: 2022-03-10

## 2022-03-05 NOTE — ED Notes (Signed)
Patient points to pain right lateral rib cage.  Worse with movement. Denies any recent injury.

## 2022-03-05 NOTE — ED Notes (Signed)
Received for discharge.   Discharge instructions given to patient and reviewed.  Patient provided the opportunity to ask questions.  Patient verbalized understanding and signed for discharge.  Patient ambulatory from department with steady gait.

## 2022-03-05 NOTE — ED Provider Notes (Signed)
ED Provider Notes by Vida RollerFosuhene, Charles M, PA at 03/05/22 1038                Author: Vida RollerFosuhene, Charles M, PA  Service: Emergency Medicine  Author Type: Physician Assistant       Filed: 03/05/22 2351  Date of Service: 03/05/22 1038  Status: Attested           Editor: Vida RollerFosuhene, Charles M, PA (Physician Assistant)  Cosigner: Lynnda ShieldsLeno, Anthony, DO at 03/07/22 [id number]          Attestation signed by Lynnda ShieldsLeno, Anthony, DO at 03/07/22 0808          I was personally available for consultation in the emergency department.  I have reviewed the chart and agree with the documentation recorded by the midlevel  provider, including the assessment, treatment plan, and disposition.      Lynnda ShieldsAnthony Leno, DO                                 31 y/o female with no significant presents with father c/o atraumatic right chest wall/ribs pain for 3 days. Pt's father  states pt smoke weed and think she strain the chest from cough from smoking weed. Pt rates the pain 7/10 and denies chest wall redness, bruise, swelling, deformity, cough, wheezes, SOB/difficulty breathing, CP/tightness, palpitation, N/V, fever, chills          History reviewed. No pertinent past medical history.        Past Surgical History:         Procedure  Laterality  Date          ?  HX ORTHOPAEDIC                 History reviewed. No pertinent family history.        Social History          Socioeconomic History         ?  Marital status:  SINGLE              Spouse name:  Not on file         ?  Number of children:  Not on file     ?  Years of education:  Not on file     ?  Highest education level:  Not on file       Occupational History        ?  Not on file       Tobacco Use         ?  Smoking status:  Every Day              Packs/day:  0.25         Types:  Cigarettes         ?  Smokeless tobacco:  Never       Substance and Sexual Activity         ?  Alcohol use:  Yes             Comment: soc         ?  Drug use:  Never     ?  Sexual activity:  Not on file        Other Topics   Concern        ?  Not on file       Social History Narrative        ?  Not on file          Social Determinants of Health          Financial Resource Strain: Not on file     Food Insecurity: Not on file     Transportation Needs: Not on file     Physical Activity: Not on file     Stress: Not on file     Social Connections: Not on file     Intimate Partner Violence: Not on file       Housing Stability: Not on file              ALLERGIES: Patient has no known allergies.      Review of Systems    Constitutional:  Negative for chills, fatigue and fever.    HENT:  Negative for congestion, drooling, mouth sores and sore throat.     Eyes:  Negative for discharge and redness.    Respiratory:  Negative for shortness of breath, wheezing and stridor.     Cardiovascular:  Positive for chest pain (right chest wall pain x 3 days). Negative  for palpitations and leg swelling.    Gastrointestinal:  Negative for diarrhea, nausea and vomiting.    Endocrine: Negative for polydipsia, polyphagia and polyuria.    Genitourinary:  Negative for dysuria, frequency and urgency.    Musculoskeletal:  Negative for gait problem and neck stiffness.    Skin:  Negative for color change, pallor and rash.    Allergic/Immunologic: Negative for environmental allergies.    Neurological:  Negative for dizziness, weakness, light-headedness and headaches.    Psychiatric/Behavioral:  Negative for agitation. The patient is not nervous/anxious.     All other systems reviewed and are negative.        Vitals:          03/05/22 1007        BP:  120/72     Pulse:  87     Resp:  18     Temp:  98.4 F (36.9 C)     SpO2:  99%     Weight:  46.3 kg (102 lb)        Height:   (1.6 m)                Physical Exam   Vitals and nursing note reviewed.    Constitutional:        General: She is not in acute distress.      Appearance: She is well-developed. She is not diaphoretic.    HENT:       Head: Normocephalic.       Right Ear: External ear normal.       Left Ear:  External ear normal.       Nose: Nose normal.    Eyes:       Conjunctiva/sclera: Conjunctivae normal.       Pupils: Pupils are equal, round, and reactive to light.    Neck:       Vascular: No JVD.    Cardiovascular:       Rate and Rhythm: Normal rate and regular rhythm.       Heart sounds: Normal heart sounds. No murmur heard.     No friction rub. No gallop.    Pulmonary:       Effort: Pulmonary effort is normal. No respiratory distress.       Breath sounds: Normal breath sounds. No stridor. No wheezing or rales.    Chest:  Chest wall: Tenderness (+diffuse TTP anterolateral lower half right chest wall. +TTP anterolateral lower right ribs. Neg flail chest  wall/erythema/swelling/ecchymosis) present. No mass, lacerations, deformity, swelling, crepitus or edema.            Abdominal:       General: Bowel sounds are normal. There is no distension.       Palpations: Abdomen is soft. There is no mass.       Tenderness: There is no abdominal tenderness.       Hernia: No hernia is present.     Musculoskeletal:       Cervical back: Normal range of motion and neck supple.     Lymphadenopathy:       Cervical: No cervical adenopathy.    Skin:      Capillary Refill: Capillary refill takes less than 2 seconds.       Coloration: Skin is not pale.       Findings: No erythema or rash.    Neurological:       Mental Status: She is alert and oriented to person, place, and time.       Sensory: No sensory deficit.       Motor: No abnormal muscle tone.       Coordination: Coordination normal.       Deep Tendon Reflexes: Reflexes normal.    Psychiatric:          Behavior: Behavior normal.           Medical Decision Making   Right chest wall pain. UA and right ribs XR w/ PA results reviewed and discussed with pt in detail. Tylenol or motrin for pain.  Apply warm compresses twice a day. No heavy lifting or any strenuous activity involving the chest. Increase fluid intake. Follow up with PCP or assign PCP. Return to Ed if symptoms worsen  or develop any new symptoms          Amount and/or Complexity of Data Reviewed   Independent Historian: parent      Details: pt   Labs: ordered. Decision-making details documented in ED Course.      Details: UA ordered and result reviewed by me   Radiology: ordered. Decision-making details documented in ED Course.      Details: ribs XR ordered and result reviewed by me      Risk   Prescription drug management.           Results for orders placed or performed during the hospital encounter of 03/05/22     HCG URINE, QL         Result  Value  Ref Range            HCG urine, QL  Negative  NEG         URINALYSIS W/ RFLX MICROSCOPIC         Result  Value  Ref Range            Color  DARK YELLOW (A)  YEL         Appearance  CLOUDY (A)  CLEAR         Specific gravity  >1.030 (H)  1.005 - 1.030       pH (UA)  5.5  5.0 - 8.0         Protein  30 (A)  NEG mg/dL       Glucose  Negative  NEG mg/dL       Ketone  TRACE (A)  NEG  mg/dL       Bilirubin  SMALL (A)  NEG         Blood  TRACE (A)  NEG         Urobilinogen  1.0  0.2 - 1.0 EU/dL       Nitrites  Negative  NEG         Leukocyte Esterase  MODERATE (A)  NEG         BILIRUBIN, CONFIRM         Result  Value  Ref Range            Bilirubin UA, confirm  Negative          URINE MICROSCOPIC         Result  Value  Ref Range            WBC  10-20  0 - 3 /hpf       RBC  3-5  0 - 2 /hpf       Epithelial cells  5-10  0 - 10 /hpf       Bacteria  TRACE (A)  NONE /hpf       Casts  NONE  NONE /lpf       Crystals, urine  NONE  NONE /LPF            Mucus  TRACE  /lpf        XR RIBS RT W PA CXR MIN 3 V      Result Date: 03/05/2022   EXAMINATION: XR RIBS RT W PA CXR MIN 3 V CLINICAL INDICATION: Female, 31 years old. pain COMPARISON: February 11, 2022 FINDINGS: Bony Thorax: Ribs are intact. Other bones of the thorax are normal. Pleura and Lungs: No pneumothorax is identified. Lungs are  well aerated and appear clear. Heart: Heart is normal in size.       No significant abnormality involving the ribs  is identified. Clear lungs. Electronically signed by:  Deborah Chalk MD  03/05/2022 11:13 AM EST Workstation: [id number]              Procedures      <EMERGENCY DEPARTMENT CASE SUMMARY>      Impression/Differential Diagnosis: 31 y/o female with no significant presents with father c/o atraumatic right chest wall/ribs pain for 3 days. Pt's father states pt smoke weed and think she strain  the chest from cough from smoking weed. Pt rates the pain 7/10 and denies chest wall redness, bruise, swelling, deformity, cough, wheezes, SOB/difficulty breathing, CP/tightness, palpitation, N/V, fever, chills      Plan: UA and right ribs XR w/ PA ordered       ED Course: UA and right ribs XR w/ PA results reviewed and discussed with pt in detail.  Chest wall strain. Tylenol or motrin for pain. Apply warm compresses twice a day. No heavy lifting or any strenuous activity involving the chest. Increase fluid intake. Follow up with PCP or assign  PCP. Return to Ed if symptoms worsen or develop any new symptoms      Final Impression/Diagnosis: right chest wall strain. UTI      Patient condition at time of disposition: Stable, d/c home         I have reviewed the following home medications:        Prior to Admission medications        Not on [address] [place], [place]

## 2022-03-05 NOTE — ED Notes (Signed)
Amb to ED from home for right side and right flank pain for several days. Denies any urinary symptoms, denies fever and N/V/D.

## 2022-03-05 NOTE — ED Notes (Signed)
Physical assessment completed. The patient's level of consciousness is alert. The patient's mood is calm and cooperative. The patient's appearance shows normal skin assessment and clean with good hygiene. The patient does not  indicate signs or symptoms of abuse or neglect.
